# Patient Record
Sex: Male | Born: 1985 | Race: White | Hispanic: No | Marital: Single | Smoking: Never smoker
Health system: Southern US, Community
[De-identification: ages and names within clinical notes are randomized; demographics above are authoritative.]

---
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
## 2010-11-11 ENCOUNTER — Inpatient Hospital Stay (INDEPENDENT_AMBULATORY_CARE_PROVIDER_SITE_OTHER)
Admission: RE | Admit: 2010-11-11 | Discharge: 2010-11-11 | Disposition: A | Discharge status: Home / Self Care | Payer: Self-pay | Source: Ambulatory Visit | Attending: Emergency Medicine | Admitting: Emergency Medicine

## 2010-11-11 DIAGNOSIS — R05 Cough: Secondary | ICD-10-CM

## 2010-11-11 DIAGNOSIS — J069 Acute upper respiratory infection, unspecified: Secondary | ICD-10-CM

## 2010-11-14 ENCOUNTER — Inpatient Hospital Stay (INDEPENDENT_AMBULATORY_CARE_PROVIDER_SITE_OTHER)
Admission: RE | Admit: 2010-11-14 | Discharge: 2010-11-14 | Disposition: A | Discharge status: Home / Self Care | Payer: Self-pay | Source: Ambulatory Visit | Attending: Family Medicine | Admitting: Family Medicine

## 2010-11-14 DIAGNOSIS — J40 Bronchitis, not specified as acute or chronic: Secondary | ICD-10-CM

## 2010-12-01 ENCOUNTER — Inpatient Hospital Stay (INDEPENDENT_AMBULATORY_CARE_PROVIDER_SITE_OTHER)
Admission: RE | Admit: 2010-12-01 | Discharge: 2010-12-01 | Disposition: A | Discharge status: Home / Self Care | Payer: Self-pay | Source: Ambulatory Visit | Attending: Emergency Medicine | Admitting: Emergency Medicine

## 2010-12-01 DIAGNOSIS — H6120 Impacted cerumen, unspecified ear: Secondary | ICD-10-CM

## 2010-12-06 ENCOUNTER — Inpatient Hospital Stay (INDEPENDENT_AMBULATORY_CARE_PROVIDER_SITE_OTHER)
Admission: RE | Admit: 2010-12-06 | Discharge: 2010-12-06 | Disposition: A | Discharge status: Home / Self Care | Payer: Self-pay | Source: Ambulatory Visit | Attending: Family Medicine | Admitting: Family Medicine

## 2010-12-06 DIAGNOSIS — H6690 Otitis media, unspecified, unspecified ear: Secondary | ICD-10-CM

## 2013-09-02 ENCOUNTER — Encounter (HOSPITAL_COMMUNITY): Payer: Self-pay | Admitting: Emergency Medicine

## 2013-09-02 ENCOUNTER — Emergency Department (INDEPENDENT_AMBULATORY_CARE_PROVIDER_SITE_OTHER)
Admission: EM | Admit: 2013-09-02 | Discharge: 2013-09-02 | Disposition: A | Discharge status: Home / Self Care | Payer: Self-pay | Source: Home / Self Care | Attending: Emergency Medicine | Admitting: Emergency Medicine

## 2013-09-02 ENCOUNTER — Emergency Department (INDEPENDENT_AMBULATORY_CARE_PROVIDER_SITE_OTHER): Payer: Self-pay

## 2013-09-02 DIAGNOSIS — S63509A Unspecified sprain of unspecified wrist, initial encounter: Secondary | ICD-10-CM

## 2013-09-02 MED ORDER — IBUPROFEN 800 MG PO TABS
800.0000 mg | ORAL_TABLET | Freq: Once | ORAL | Status: AC
  Administered 2013-09-02: 800 mg via ORAL

## 2013-09-02 MED ORDER — HYDROCODONE-ACETAMINOPHEN 5-325 MG PO TABS: 1.0000 | ORAL_TABLET | ORAL | Status: AC | PRN

## 2013-09-02 MED ORDER — IBUPROFEN 800 MG PO TABS
ORAL_TABLET | ORAL | Status: AC
  Filled 2013-09-02: qty 1

## 2013-09-02 MED ORDER — TRAMADOL HCL 50 MG PO TABS: 50.0000 mg | ORAL_TABLET | Freq: Four times a day (QID) | ORAL | Status: AC | PRN

## 2013-09-02 NOTE — ED Provider Notes (Signed)
CSN: [id number]     Arrival date & time 09/02/13  1723 History   First MD Initiated Contact with Patient 09/02/13 1958     Chief Complaint  Patient presents with  . Wrist Pain    Patient is a 28 y.o. male presenting with wrist pain. The history is provided by the patient.  Wrist Pain This is a new problem. The current episode started more than 2 days ago. The problem occurs constantly. The problem has not changed since onset.The symptoms are aggravated by bending and twisting. Nothing relieves the symptoms.  Pt reports pain (R) wrist since Wednesday. States that on Tuesday at work he spent most of the day pushing a very heavy piece of equipment around. States when he awoke Wednesday AM he noted the pain. Describes the pain as a tenderness with sharp shooting pains at times especially w/ extension of his (R) hand.  History reviewed. No pertinent past medical history. History reviewed. No pertinent past surgical history. No family history on file. History  Substance Use Topics  . Smoking status: Never Smoker   . Smokeless tobacco: Not on file  . Alcohol Use: No    Review of Systems  All other systems reviewed and are negative.    Allergies  Review of patient's allergies indicates no known allergies.  Home Medications   Current Outpatient Rx  Name  Route  Sig  Dispense  Refill  . acetaminophen (TYLENOL) 325 MG tablet   Oral   Take 650 mg by mouth every 6 (six) hours as needed.         Marland Kitchen ibuprofen (ADVIL,MOTRIN) 200 MG tablet   Oral   Take 200 mg by mouth every 6 (six) hours as needed.         Marland Kitchen HYDROcodone-acetaminophen (NORCO/VICODIN) 5-325 MG per tablet   Oral   Take 1 tablet by mouth every 4 (four) hours as needed for severe pain.   10 tablet   0   . traMADol (ULTRAM) 50 MG tablet   Oral   Take 1 tablet (50 mg total) by mouth every 6 (six) hours as needed for severe pain.   15 tablet   0    BP 132/83  Pulse 54  Temp(Src) 98.7 F (37.1 C) (Oral)  Resp 16   SpO2 99% Physical Exam  Nursing note and vitals reviewed. Constitutional: He is oriented to person, place, and time. He appears well-developed and well-nourished.  HENT:  Head: Normocephalic and atraumatic.  Eyes: Conjunctivae are normal.  Cardiovascular: Bradycardia present.   In setting of young male w/ athletic build.  Pulmonary/Chest: Effort normal.  Musculoskeletal: He exhibits tenderness.  Mild swelling and TTP over the medial (R) dorsal wrist. No obvious deformity or open wound. Good ROM of fingers. Normal flexion and extension of wrist w/ increased pain on extension.  Neurological: He is alert and oriented to person, place, and time.  Skin: Skin is warm and dry.  Psychiatric: He has a normal mood and affect.    ED Course  Procedures (including critical care time) Labs Review Labs Reviewed - No data to display Imaging Review Dg Wrist Complete Right  09/02/2013   CLINICAL DATA:  Right wrist pain x3 days, no known injury  EXAM: RIGHT WRIST - COMPLETE 3+ VIEW  COMPARISON:  None.  FINDINGS: No fracture or dislocation is seen.  The joint spaces are preserved.  The visualized soft tissues are unremarkable.  IMPRESSION: No acute osseous abnormality is seen.   Electronically Signed  By: Charline BillsSriyesh  Krishnan M.D.   On: 09/02/2013 20:54     MDM   1. Wrist sprain    (R) wrist pain for 3 days after several hours of pushing a very heavy piece of equipment around at work. Imaging neg for fx. Good ROM, some increased pain w/ extension of (R) hand.  Will treat for sprain. Pt placed in a velcro wrist splint and home care instructions discussed and provided in writing. Pt to arrange f/u w/ ortho referral provided if not gradually improving over the next several days.     Leanne ChangKatherine P Demmi Sindt, NP 09/04/13 1310  Leanne ChangKatherine P Hughie Melroy, NP 09/04/13 1312

## 2013-09-02 NOTE — ED Notes (Signed)
Right wrist pain that started on Wednesday morning. Noticed when he woke that morning, he had shooting pain in right wrist.  No known injury.  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  Patient is right handed.  Particularly feels pain in wrist with bringing fingers back towards forearm

## 2013-09-02 NOTE — Discharge Instructions (Signed)
The likely source of your pain is a wrist sprain. Rest the wrist over the next 2 days using cold packs for 20-30 minutes several times a day. Wear the splint until your pain resolves. Take Ibuprofen 800 mg 3 times a day for the next 5 days. Use the medication for pain as prescribed for more severe pain. The Tramadol can be used at work for pain as it does not make you drowsy. If the pain does not improve or worsens over the next week to 10 days you will need to arrange follow up with Dr Eulah PontMurphy for further evaluation.  Wrist Sprain  A sprain is an injury in which a ligament that maintains the proper alignment of a joint is partially or completely torn. The ligaments of the wrist are susceptible to sprains. Sprains are classified into three categories. Grade 1 sprains cause pain, but the tendon is not lengthened. Grade 2 sprains include a lengthened ligament because the ligament is stretched or partially ruptured. With grade 2 sprains there is still function, although the function may be diminished. Grade 3 sprains are characterized by a complete tear of the tendon or muscle, and function is usually impaired. SYMPTOMS   Pain tenderness, inflammation, and/or bruising (contusion) of the injury.  A "pop" or tear felt and/or heard at the time of injury.  Decreased wrist function. CAUSES  A wrist sprain occurs when a force is placed on one or more ligaments that is greater than it/they can withstand. Common mechanisms of injury include:  Catching a ball with you hands.  Repetitive and/ or strenuous extension or flexion of the wrist. RISK INCREASES WITH:  Previous wrist injury.  Contact sports (boxing or wrestling).  Activities in which falling is common.  Poor strength and flexibility.  Improperly fitted or padded protective equipment. PREVENTION  Warm up and stretch properly before activity.  Allow for adequate recovery between workouts.  Maintain physical fitness:  Strength,  flexibility, and endurance.  Cardiovascular fitness.  Protect the wrist joint by limiting its motion with the use of taping, braces, or splints.  Protect the wrist after injury for 6 to 12 months. PROGNOSIS  The prognosis for wrist sprains depends on the degree of injury. Grade 1 sprains require 2 to 6 weeks of treatment. Grade 2 sprains require 6 to 8 weeks of treatment, and grade 3 sprains require up to 12 weeks.  RELATED COMPLICATIONS   Prolonged healing time, if improperly treated or re-injured.  Recurrent symptoms that result in a chronic problem.  Injury to nearby structures (bone, cartilage, nerves, or tendons).  Arthritis of the wrist.  Inability to compete in athletics at a high level.  Wrist stiffness or weakness.  Progression to a complete rupture of the ligament. TREATMENT  Treatment initially involves resting from any activities that aggravate the symptoms, and the use of ice and medications to help reduce pain and inflammation. Your caregiver may recommend immobilizing the wrist for a period of time in order to reduce stress on the ligament and allow for healing. After immobilization it is important to perform strengthening and stretching exercises to help regain strength and a full range of motion. These exercises may be completed at home or with a therapist. Surgery is not usually required for wrist sprains, unless the ligament has been ruptured (grade 3 sprain). MEDICATION   If pain medication is necessary, then nonsteroidal anti-inflammatory medications, such as aspirin and ibuprofen, or other minor pain relievers, such as acetaminophen, are often recommended.  Do not  take pain medication for 7 days before surgery.  Prescription pain relievers may be given if deemed necessary by your caregiver. Use only as directed and only as much as you need. HEAT AND COLD  Cold treatment (icing) relieves pain and reduces inflammation. Cold treatment should be applied for 10 to  15 minutes every 2 to 3 hours for inflammation and pain and immediately after any activity that aggravates your symptoms. Use ice packs or massage the area with a piece of ice (ice massage).  Heat treatment may be used prior to performing the stretching and strengthening activities prescribed by your caregiver, physical therapist, or athletic trainer. Use a heat pack or soak your injury in warm water. SEEK MEDICAL CARE IF:  Treatment seems to offer no benefit, or the condition worsens.  Any medications produce adverse side effects.

## 2013-09-05 NOTE — ED Provider Notes (Signed)
Medical screening examination/treatment/procedure(s) were performed by non-physician practitioner and as supervising physician I was immediately available for consultation/collaboration.  Ademide Schaberg, M.D.  Dquan Cortopassi C Alzada Brazee, MD 09/05/13 0749 

## 2014-11-22 ENCOUNTER — Ambulatory Visit (HOSPITAL_COMMUNITY)
Admission: RE | Admit: 2014-11-22 | Discharge: 2014-11-22 | Disposition: A | Discharge status: Home / Self Care | Payer: 59 | Source: Ambulatory Visit | Attending: Orthopedic Surgery | Admitting: Orthopedic Surgery

## 2014-11-22 ENCOUNTER — Other Ambulatory Visit (HOSPITAL_COMMUNITY): Payer: Self-pay | Admitting: Orthopedic Surgery

## 2014-11-22 DIAGNOSIS — Z048 Encounter for examination and observation for other specified reasons: Secondary | ICD-10-CM | POA: Diagnosis not present

## 2014-11-22 DIAGNOSIS — S0540XA Penetrating wound of orbit with or without foreign body, unspecified eye, initial encounter: Secondary | ICD-10-CM

## 2015-01-26 ENCOUNTER — Other Ambulatory Visit: Payer: Self-pay | Admitting: Orthopedic Surgery

## 2015-04-12 ENCOUNTER — Ambulatory Visit (HOSPITAL_BASED_OUTPATIENT_CLINIC_OR_DEPARTMENT_OTHER): Admit: 2015-04-12 | Payer: 59 | Admitting: Orthopedic Surgery

## 2015-04-12 ENCOUNTER — Encounter (HOSPITAL_BASED_OUTPATIENT_CLINIC_OR_DEPARTMENT_OTHER): Payer: Self-pay

## 2015-04-12 SURGERY — RECESSION, MUSCLE, GASTROCNEMIUS
Anesthesia: General | Site: Foot | Laterality: Left

## 2015-08-27 ENCOUNTER — Encounter (HOSPITAL_COMMUNITY): Payer: Self-pay | Admitting: Emergency Medicine

## 2015-08-27 ENCOUNTER — Emergency Department (HOSPITAL_COMMUNITY): Payer: PRIVATE HEALTH INSURANCE

## 2015-08-27 ENCOUNTER — Emergency Department (HOSPITAL_COMMUNITY)
Admission: EM | Admit: 2015-08-27 | Discharge: 2015-08-27 | Disposition: A | Discharge status: Home / Self Care | Payer: PRIVATE HEALTH INSURANCE | Attending: Emergency Medicine | Admitting: Emergency Medicine

## 2015-08-27 DIAGNOSIS — M79641 Pain in right hand: Secondary | ICD-10-CM | POA: Diagnosis present

## 2015-08-27 DIAGNOSIS — R2231 Localized swelling, mass and lump, right upper limb: Secondary | ICD-10-CM | POA: Diagnosis not present

## 2015-08-27 DIAGNOSIS — M7989 Other specified soft tissue disorders: Secondary | ICD-10-CM

## 2015-08-27 NOTE — ED Provider Notes (Signed)
CSN: [id number]     Arrival date & time 08/27/15  0718 History   First MD Initiated Contact with Patient 08/27/15 [phone]     Chief Complaint  Patient presents with  . Hand Pain     (Consider location/radiation/quality/duration/timing/severity/associated sxs/prior Treatment) HPI  30 year old male presents with right hand pain started yesterday shortly after lunch. 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 He is not sure if he injured it on something but during work knows that his hand was hurting and he had swelling over his right second MCP. Patient states the pain seems to be somewhat improved since original onset. He has full range of motion of his hand. Denies any increased warmth. No weakness or numbness. Has not taken anything for pain, declines pain meds/ice at this time.  History reviewed. No pertinent past medical history. History reviewed. No pertinent past surgical history. No family history on file. Social History  Substance Use Topics  . Smoking status: Never Smoker   . Smokeless tobacco: None  . Alcohol Use: No    Review of Systems  Musculoskeletal: Positive for joint swelling and arthralgias.  Skin: Negative for wound.  Neurological: Negative for weakness and numbness.  All other systems reviewed and are negative.     Allergies  Review of patient's allergies indicates no known allergies.  Home Medications   Prior to Admission medications   Medication Sig Start Date End Date Taking? Authorizing Provider  acetaminophen (TYLENOL) 325 MG tablet Take 650 mg by mouth every 6 (six) hours as needed.    Historical Provider, MD  HYDROcodone-acetaminophen (NORCO/VICODIN) 5-325 MG per tablet Take 1 tablet by mouth every 4 (four) hours as needed for severe pain. 09/02/13   Roma Kayser Schorr, NP  ibuprofen (ADVIL,MOTRIN) 200 MG tablet Take 200 mg by mouth every 6 (six) hours as needed.    Historical Provider, MD  traMADol (ULTRAM) 50 MG tablet Take 1 tablet (50 mg  total) by mouth every 6 (six) hours as needed for severe pain. 09/02/13   Roma Kayser Schorr, NP   BP 123/94 mmHg  Pulse 81  Temp(Src) 98.6 F (37 C) (Oral)  Resp 18  SpO2 98% Physical Exam  Constitutional: He is oriented to person, place, and time. He appears well-developed and well-nourished.  HENT:  Head: Normocephalic and atraumatic.  Nose: Nose normal.  Eyes: Right eye exhibits no discharge. Left eye exhibits no discharge.  Cardiovascular: Normal rate, regular rhythm and intact distal pulses.   Pulmonary/Chest: Effort normal.  Abdominal: He exhibits no distension.  Musculoskeletal: He exhibits no edema.       Right wrist: He exhibits normal range of motion and no tenderness.       Right hand: He exhibits tenderness (mild). He exhibits normal range of motion and no deformity.       Hands: Neurological: He is alert and oriented to person, place, and time.  Skin: Skin is warm and dry. No erythema.  Nursing note and vitals reviewed.   ED Course  Procedures (including critical care time) Labs Review Labs Reviewed - No data to display  Imaging Review Dg Hand Complete Right  08/27/2015  CLINICAL DATA:  30 year old male with new onset right hand pain. No known trauma. EXAM: RIGHT HAND - COMPLETE 3+ VIEW COMPARISON:  Prior radiographs of the right wrist 09/02/2013 FINDINGS: There is no evidence of fracture or dislocation. There is no evidence of arthropathy or other focal bone abnormality. Soft tissues are unremarkable. IMPRESSION: Negative.  Electronically Signed   By: Malachy Moan M.D.   On: 08/27/2015 08:12   I have personally reviewed and evaluated these images and lab results as part of my medical decision-making.   EKG Interpretation None      MDM   Final diagnoses:  Swelling of right hand    Patient most likely injured his hand but doesn't remember. No signs of infection, has full ROM without pain. Xray negative. D/c home with plan for nsaids and ice, return if  symptoms don't improve or worsen.    Pricilla Loveless, MD 08/27/15 [phone]

## 2015-08-27 NOTE — ED Notes (Signed)
Pt reports new onset right hand pain yesterday; FOM; denies injury.

## 2016-12-02 IMAGING — DX DG ORBITS FOR FOREIGN BODY
2 series · 2 of 2 positions shown · non-contrast
Comparison: None

CLINICAL DATA: Metal exposure to eyes, MR Delp

EXAM:
ORBITS FOR FOREIGN BODY - 2 VIEW

[orbital waters (1 of 2)]
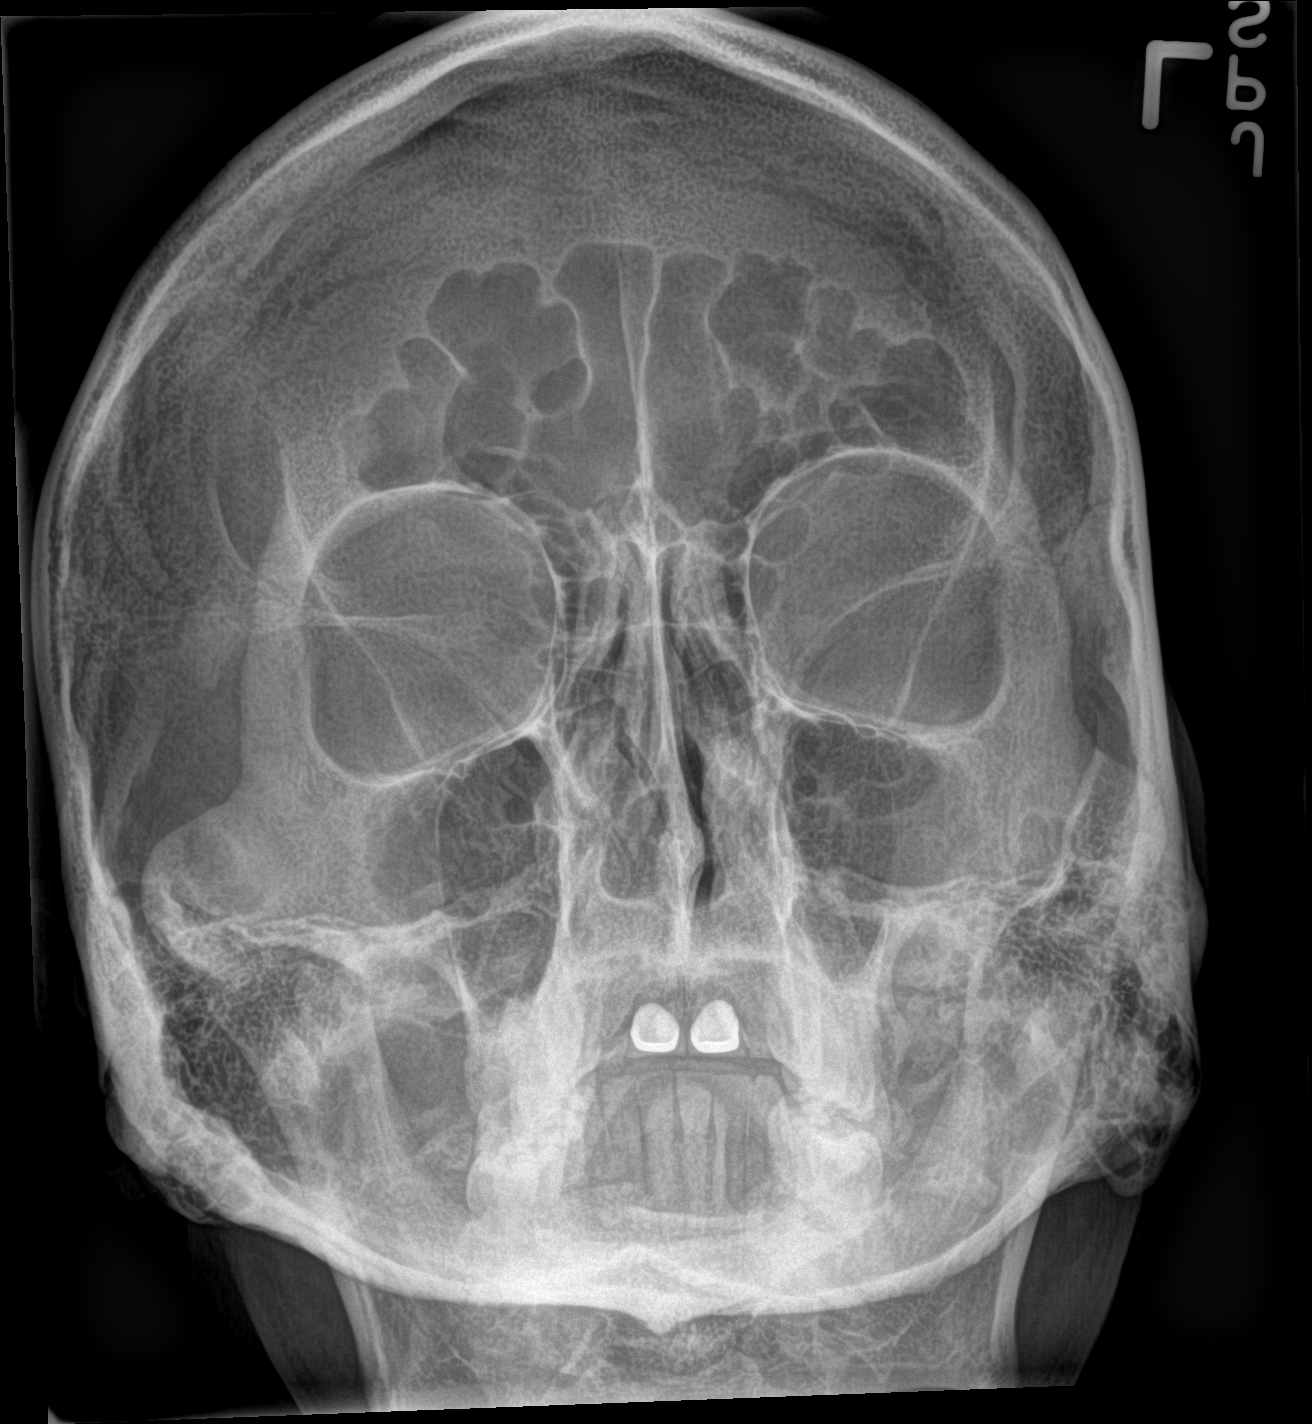

[orbital waters (2 of 2)]
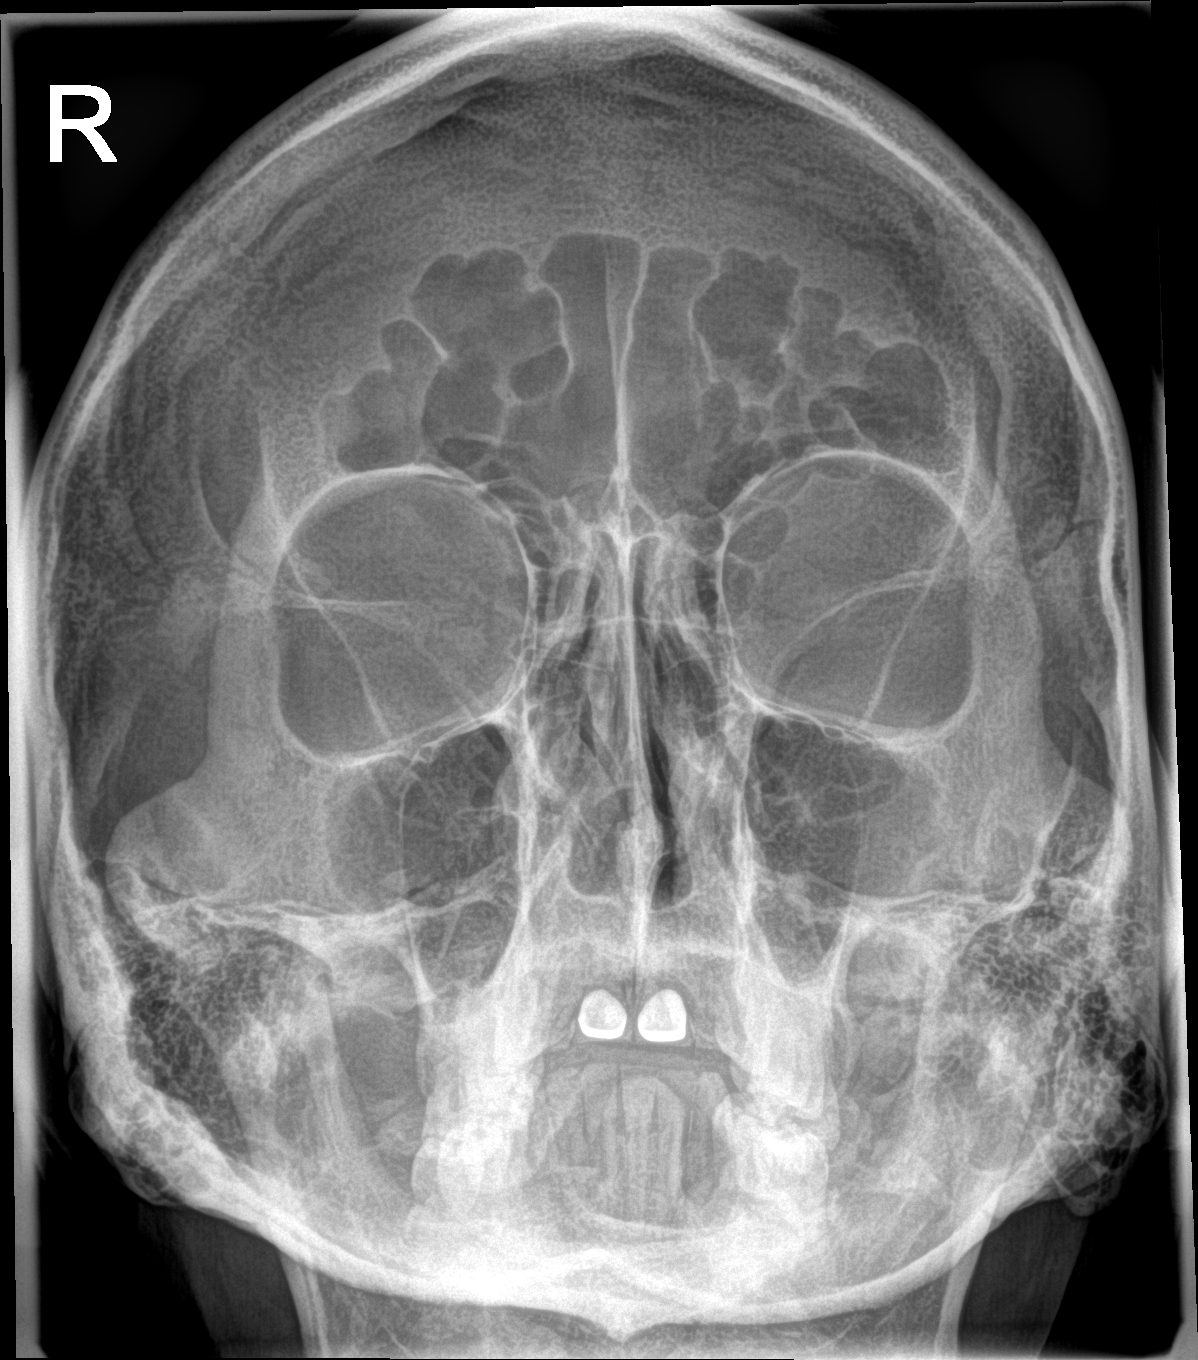

[2 of 2 positions shown; findings below may reference images not displayed]

FINDINGS: No orbital metallic foreign bodies.

Osseous mineralization normal for technique.

Nasal septum midline.

Paranasal sinuses clear.
IMPRESSION: No orbital metallic foreign bodies identified.

## 2022-02-06 ENCOUNTER — Emergency Department (HOSPITAL_BASED_OUTPATIENT_CLINIC_OR_DEPARTMENT_OTHER)
Admission: EM | Admit: 2022-02-06 | Discharge: 2022-02-06 | Disposition: A | Discharge status: Home / Self Care | Payer: PRIVATE HEALTH INSURANCE | Attending: Emergency Medicine | Admitting: Emergency Medicine

## 2022-02-06 ENCOUNTER — Emergency Department (HOSPITAL_BASED_OUTPATIENT_CLINIC_OR_DEPARTMENT_OTHER): Payer: PRIVATE HEALTH INSURANCE

## 2022-02-06 ENCOUNTER — Encounter (HOSPITAL_BASED_OUTPATIENT_CLINIC_OR_DEPARTMENT_OTHER): Payer: Self-pay | Admitting: Emergency Medicine

## 2022-02-06 DIAGNOSIS — R1031 Right lower quadrant pain: Secondary | ICD-10-CM | POA: Diagnosis present

## 2022-02-06 DIAGNOSIS — N201 Calculus of ureter: Secondary | ICD-10-CM | POA: Diagnosis not present

## 2022-02-06 LAB — COMPREHENSIVE METABOLIC PANEL
ALT: 28 U/L (ref 0–44)
AST: 23 U/L (ref 15–41)
Albumin: 4.5 g/dL (ref 3.5–5.0)
Alkaline Phosphatase: 101 U/L (ref 38–126)
Anion gap: 10 (ref 5–15)
BUN: 16 mg/dL (ref 6–20)
CO2: 26 mmol/L (ref 22–32)
Calcium: 9.2 mg/dL (ref 8.9–10.3)
Chloride: 104 mmol/L (ref 98–111)
Creatinine, Ser: 1.24 mg/dL (ref 0.61–1.24)
GFR, Estimated: >60 mL/min (ref >60)
Glucose, Bld: 129 mg/dL — ABNORMAL HIGH (ref 70–99)
Potassium: 3.7 mmol/L (ref 3.5–5.1)
Sodium: 140 mmol/L (ref 135–145)
Total Bilirubin: 1.2 mg/dL (ref 0.3–1.2)
Total Protein: 7.5 g/dL (ref 6.5–8.1)

## 2022-02-06 LAB — URINALYSIS, ROUTINE W REFLEX MICROSCOPIC
Bilirubin Urine: NEGATIVE
Glucose, UA: 100 mg/dL — AB
Ketones, ur: NEGATIVE mg/dL
Leukocytes,Ua: NEGATIVE
Nitrite: NEGATIVE
Protein, ur: NEGATIVE mg/dL
Specific Gravity, Urine: 1.02 (ref 1.005–1.030)
pH: 5.5 (ref 5.0–8.0)

## 2022-02-06 LAB — CBC WITH DIFFERENTIAL/PLATELET
Abs Immature Granulocytes: 0.01 10*3/uL (ref 0.00–0.07)
Basophils Absolute: 0 10*3/uL (ref 0.0–0.1)
Basophils Relative: 0 %
Eosinophils Absolute: 0.1 10*3/uL (ref 0.0–0.5)
Eosinophils Relative: 1 %
HCT: 49.1 % (ref 39.0–52.0)
Hemoglobin: 17.2 g/dL — ABNORMAL HIGH (ref 13.0–17.0)
Immature Granulocytes: 0 %
Lymphocytes Relative: 26 %
Lymphs Abs: 2 10*3/uL (ref 0.7–4.0)
MCH: 33.3 pg (ref 26.0–34.0)
MCHC: 35 g/dL (ref 30.0–36.0)
MCV: 95 fL (ref 80.0–100.0)
Monocytes Absolute: 0.4 10*3/uL (ref 0.1–1.0)
Monocytes Relative: 6 %
Neutro Abs: 5.2 10*3/uL (ref 1.7–7.7)
Neutrophils Relative %: 67 %
Platelets: 186 10*3/uL (ref 150–400)
RBC: 5.17 MIL/uL (ref 4.22–5.81)
RDW: 12.6 % (ref 11.5–15.5)
WBC: 7.7 10*3/uL (ref 4.0–10.5)
nRBC: 0 % (ref 0.0–0.2)

## 2022-02-06 LAB — URINALYSIS, MICROSCOPIC (REFLEX)

## 2022-02-06 LAB — LIPASE, BLOOD: Lipase: 37 U/L (ref 11–51)

## 2022-02-06 MED ORDER — IBUPROFEN 800 MG PO TABS: 800.0000 mg | ORAL_TABLET | Freq: Three times a day (TID) | ORAL | 0 refills | Status: AC | PRN

## 2022-02-06 MED ORDER — TAMSULOSIN HCL 0.4 MG PO CAPS: 0.4000 mg | ORAL_CAPSULE | Freq: Every day | ORAL | 0 refills | Status: AC

## 2022-02-06 MED ORDER — SENNOSIDES-DOCUSATE SODIUM 8.6-50 MG PO TABS: 1.0000 | ORAL_TABLET | Freq: Every evening | ORAL | 0 refills | Status: AC | PRN

## 2022-02-06 MED ORDER — OXYCODONE-ACETAMINOPHEN 5-325 MG PO TABS: 1.0000 | ORAL_TABLET | Freq: Four times a day (QID) | ORAL | 0 refills | Status: AC | PRN

## 2022-02-06 MED ORDER — ONDANSETRON 4 MG PO TBDP: 4.0000 mg | ORAL_TABLET | Freq: Three times a day (TID) | ORAL | 0 refills | Status: AC | PRN

## 2022-02-06 MED ORDER — ONDANSETRON HCL 4 MG/2ML IJ SOLN
4.0000 mg | Freq: Once | INTRAMUSCULAR | Status: AC
  Administered 2022-02-06: 4 mg via INTRAVENOUS
  Filled 2022-02-06: qty 2

## 2022-02-06 MED ORDER — KETOROLAC TROMETHAMINE 30 MG/ML IJ SOLN
30.0000 mg | Freq: Once | INTRAMUSCULAR | Status: AC
  Administered 2022-02-06: 30 mg via INTRAVENOUS
  Filled 2022-02-06: qty 1

## 2022-02-06 NOTE — Discharge Instructions (Signed)

## 2022-02-06 NOTE — ED Notes (Signed)
During triage process pt began vomiting

## 2022-02-06 NOTE — ED Provider Notes (Signed)
Emergency Department Provider Note   I have reviewed the triage vital signs and the nursing notes.   HISTORY  Chief Complaint Abdominal Pain   HPI John Anderson is a 36 y.o. male with prior history of kidney stone presents to the emergency department for evaluation of right lower quadrant pain with nausea and vomiting.  He reports similar symptoms in the past with a kidney stone.  He was ultimately able to pass that stone without urology intervention.  With this episode he has not had fevers or chills.  No blood in the urine.  No back or flank pain.  No pain to the testicle areas or groin.   History reviewed. No pertinent past medical history.  Review of Systems  Constitutional: No fever/chills Eyes: No visual changes. ENT: No sore throat. Cardiovascular: Denies chest pain. Respiratory: Denies shortness of breath. Gastrointestinal: Positive RLQ abdominal pain.  Positive nausea and vomiting.  No diarrhea.  No constipation. Genitourinary: Negative for dysuria. Musculoskeletal: Negative for back pain. Skin: Negative for rash. Neurological: Negative for headaches, focal weakness or numbness.   ____________________________________________   PHYSICAL EXAM:  VITAL SIGNS: ED Triage Vitals  Enc Vitals Group     BP 02/06/22 0704 (!) 126/102     Pulse Rate 02/06/22 0704 69     Resp 02/06/22 0704 18     Temp 02/06/22 0704 97.8 F (36.6 C)     Temp Source 02/06/22 0704 Oral     SpO2 02/06/22 0704 99 %     Weight 02/06/22 0700 144 lb (65.3 kg)     Height 02/06/22 0700 5\' 3"  (1.6 m)   Constitutional: Alert and oriented. Patient appears uncomfortable on arrival with frequent moving and shifting in bed.  Eyes: Conjunctivae are normal. Head: Atraumatic. Nose: No congestion/rhinnorhea. Mouth/Throat: Mucous membranes are moist.   Neck: No stridor.   Cardiovascular: Normal rate, regular rhythm. Good peripheral circulation. Grossly normal heart sounds.   Respiratory: Normal  respiratory effort.  No retractions. Lungs CTAB. Gastrointestinal: Soft with mild tenderness in the RLQ. No peritonitis. No distention.  Musculoskeletal: No lower extremity tenderness nor edema. No gross deformities of extremities. Neurologic:  Normal speech and language. No gross focal neurologic deficits are appreciated.  Skin:  Skin is warm, dry and intact. No rash noted.   ____________________________________________   LABS (all labs ordered are listed, but only abnormal results are displayed)  Labs Reviewed  COMPREHENSIVE METABOLIC PANEL - Abnormal; Notable for the following components:      Result Value   Glucose, Bld 129 (*)    All other components within normal limits  CBC WITH DIFFERENTIAL/PLATELET - Abnormal; Notable for the following components:   Hemoglobin 17.2 (*)    All other components within normal limits  URINALYSIS, ROUTINE W REFLEX MICROSCOPIC - Abnormal; Notable for the following components:   Glucose, UA 100 (*)    Hgb urine dipstick MODERATE (*)    All other components within normal limits  URINALYSIS, MICROSCOPIC (REFLEX) - Abnormal; Notable for the following components:   Bacteria, UA RARE (*)    All other components within normal limits  URINE CULTURE  LIPASE, BLOOD   ____________________________________________   PROCEDURES  Procedure(s) performed:   Procedures   ____________________________________________   INITIAL IMPRESSION / ASSESSMENT AND PLAN / ED COURSE  Pertinent labs & imaging results that were available during my care of the patient were reviewed by me and considered in my medical decision making (see chart for details).   This patient  is Presenting for Evaluation of abdominal pain, which does require a range of treatment options, and is a complaint that involves a high risk of morbidity and mortality.  The Differential Diagnoses includes but is not exclusive to acute appendicitis, renal colic, testicular torsion, urinary tract  infection, prostatitis,  diverticulitis, small bowel obstruction, colitis, abdominal aortic aneurysm, gastroenteritis, constipation etc.   Critical Interventions-    Medications  ondansetron (ZOFRAN) injection 4 mg (4 mg Intravenous Given 02/06/22 0735)  ketorolac (TORADOL) 30 MG/ML injection 30 mg (30 mg Intravenous Given 02/06/22 0739)    Reassessment after intervention: Pain managed well in the ED.    I did obtain Additional Historical Information from family at bedside.    Clinical Laboratory Tests Ordered, included no leukocytosis or acute kidney injury.  Lipase negative.  Radiologic Tests Ordered, included CT renal. I independently interpreted the images and agree with radiology interpretation.   Cardiac Monitor Tracing which shows NSR.   Medical Decision Making: Summary:  Patient presents emergency department the right lower quadrant abdominal pain consistent mostly with renal colic.  He does have some mild tenderness raising suspicion for alternate etiologies such as acute appendicitis of the lower suspicion for this clinically.  Appears to have ureteral stone at the right UVJ on my assessment of the imaging but will wait for official radiology interpretation.  Patient's pain and nausea are improved with Toradol and Zofran here.   Reevaluation with update and discussion with patient. CT showing ureteral stone on the side of the patient's pain. He is much more comfortable on re-evaluation. Plan for pain mgmt at home with plan for patient to call Urology for follow up.   Considered admission but pain is well managed and no indication for emergent removal of stone by Urology.   Disposition: discharge  ____________________________________________  FINAL CLINICAL IMPRESSION(S) / ED DIAGNOSES  Final diagnoses:  Right ureteral stone     NEW OUTPATIENT MEDICATIONS STARTED DURING THIS VISIT:  Discharge Medication List as of 02/06/2022  9:31 AM     START taking these medications    Details  ondansetron (ZOFRAN-ODT) 4 MG disintegrating tablet Take 1 tablet (4 mg total) by mouth every 8 (eight) hours as needed., Starting Thu 02/06/2022, Normal    oxyCODONE-acetaminophen (PERCOCET/ROXICET) 5-325 MG tablet Take 1 tablet by mouth every 6 (six) hours as needed for severe pain., Starting Thu 02/06/2022, Normal    senna-docusate (SENOKOT-S) 8.6-50 MG tablet Take 1 tablet by mouth at bedtime as needed for moderate constipation., Starting Thu 02/06/2022, Normal    tamsulosin (FLOMAX) 0.4 MG CAPS capsule Take 1 capsule (0.4 mg total) by mouth daily for 14 days., Starting Thu 02/06/2022, Until Thu 02/20/2022, Normal        Note:  This document was prepared using Dragon voice recognition software and may include unintentional dictation errors.  Alona Bene, MD, [facility] Emergency Medicine    Nikelle Malatesta, Arlyss Repress, MD 02/10/22 [phone]

## 2022-02-06 NOTE — ED Notes (Signed)
ED Provider at bedside. 

## 2022-02-06 NOTE — ED Triage Notes (Signed)
Pt states he is having pain in his right lower quadrant  Pt states he thinks he may have a kidney stone  Has hx of same  Pt has nausea without vomiting   Denies any urinary sxs or back pain   States he has a little pain in his groin area

## 2022-02-07 LAB — URINE CULTURE: Culture: NO GROWTH

## 2022-08-06 ENCOUNTER — Encounter (HOSPITAL_BASED_OUTPATIENT_CLINIC_OR_DEPARTMENT_OTHER): Payer: Self-pay | Admitting: Emergency Medicine

## 2022-08-06 ENCOUNTER — Emergency Department (HOSPITAL_BASED_OUTPATIENT_CLINIC_OR_DEPARTMENT_OTHER): Payer: PRIVATE HEALTH INSURANCE

## 2022-08-06 ENCOUNTER — Other Ambulatory Visit: Payer: Self-pay

## 2022-08-06 ENCOUNTER — Emergency Department (HOSPITAL_BASED_OUTPATIENT_CLINIC_OR_DEPARTMENT_OTHER)
Admission: EM | Admit: 2022-08-06 | Discharge: 2022-08-06 | Disposition: A | Discharge status: Home / Self Care | Payer: PRIVATE HEALTH INSURANCE | Attending: Emergency Medicine | Admitting: Emergency Medicine

## 2022-08-06 DIAGNOSIS — Z20822 Contact with and (suspected) exposure to covid-19: Secondary | ICD-10-CM | POA: Insufficient documentation

## 2022-08-06 DIAGNOSIS — M940 Chondrocostal junction syndrome [Tietze]: Secondary | ICD-10-CM | POA: Insufficient documentation

## 2022-08-06 DIAGNOSIS — J101 Influenza due to other identified influenza virus with other respiratory manifestations: Secondary | ICD-10-CM | POA: Insufficient documentation

## 2022-08-06 DIAGNOSIS — J069 Acute upper respiratory infection, unspecified: Secondary | ICD-10-CM

## 2022-08-06 LAB — RESP PANEL BY RT-PCR (RSV, FLU A&B, COVID)  RVPGX2
Influenza A by PCR: POSITIVE — AB
Influenza B by PCR: NEGATIVE
Resp Syncytial Virus by PCR: NEGATIVE
SARS Coronavirus 2 by RT PCR: NEGATIVE

## 2022-08-06 MED ORDER — LIDOCAINE 5 % EX PTCH
1.0000 | MEDICATED_PATCH | CUTANEOUS | Status: DC
  Administered 2022-08-06: 1 via TRANSDERMAL
  Filled 2022-08-06: qty 1

## 2022-08-06 MED ORDER — LIDOCAINE 5 % EX PTCH: 1.0000 | MEDICATED_PATCH | CUTANEOUS | 0 refills | Status: AC

## 2022-08-06 MED ORDER — BENZONATATE 100 MG PO CAPS: 100.0000 mg | ORAL_CAPSULE | Freq: Three times a day (TID) | ORAL | 0 refills | Status: DC

## 2022-08-06 MED ORDER — BENZONATATE 100 MG PO CAPS
100.0000 mg | ORAL_CAPSULE | Freq: Once | ORAL | Status: AC
  Administered 2022-08-06: 100 mg via ORAL
  Filled 2022-08-06: qty 1

## 2022-08-06 NOTE — Discharge Instructions (Addendum)
Please check the portal to see if you have COVID or flu tonight.  Make sure you are drinking lots of fluids, and using lidocaine patch for your chest discomfort.  You can also use the Tessalon Perles to help with your cough.  And take Tylenol and ibuprofen over-the-counter.  If you have severe shortness of breath severe chest pain, or intractable nausea or vomiting please return to the ER.

## 2022-08-06 NOTE — ED Triage Notes (Signed)
Pt w/ cough and not feeling well since Sun

## 2022-08-06 NOTE — ED Provider Notes (Signed)
[facility] Provider Note   CSN: [id number] Arrival date & time: 08/06/22  1705     History  Chief Complaint  Patient presents with   Cough    John Anderson is a 37 y.o. male, no pertinent past medical history, who presents to the ED secondary to a productive cough, headache, and chills for the last 3 days.  States that he has chest pain when he coughs, in the middle of his chest, denies any chest pain at rest or exertion.  Denies any nausea, vomiting, diarrhea.    Home Medications Prior to Admission medications   Medication Sig Start Date End Date Taking? Authorizing Provider  benzonatate (TESSALON) 100 MG capsule Take 1 capsule (100 mg total) by mouth every 8 (eight) hours. 08/06/22  Yes Romin Divita L, PA  lidocaine (LIDODERM) 5 % Place 1 patch onto the skin daily. Remove & Discard patch within 12 hours or as directed by MD 08/06/22  Yes Rawson Minix L, PA  acetaminophen (TYLENOL) 325 MG tablet Take 650 mg by mouth every 6 (six) hours as needed.    [provider]  HYDROcodone-acetaminophen (NORCO/VICODIN) 5-325 MG per tablet Take 1 tablet by mouth every 4 (four) hours as needed for severe pain. 09/02/13   Schorr, Rhetta Mura, FNP  ibuprofen (ADVIL) 800 MG tablet Take 1 tablet (800 mg total) by mouth every 8 (eight) hours as needed for moderate pain. 02/06/22   Long, Wonda Olds, MD  ondansetron (ZOFRAN-ODT) 4 MG disintegrating tablet Take 1 tablet (4 mg total) by mouth every 8 (eight) hours as needed. 02/06/22   Long, Wonda Olds, MD  oxyCODONE-acetaminophen (PERCOCET/ROXICET) 5-325 MG tablet Take 1 tablet by mouth every 6 (six) hours as needed for severe pain. 02/06/22   Long, Wonda Olds, MD  senna-docusate (SENOKOT-S) 8.6-50 MG tablet Take 1 tablet by mouth at bedtime as needed for moderate constipation. 02/06/22   Long, Wonda Olds, MD  traMADol (ULTRAM) 50 MG tablet Take 1 tablet (50 mg total) by mouth every 6 (six) hours as needed for severe  pain. 09/02/13   Schorr, Rhetta Mura, FNP      Allergies    Patient has no known allergies.    Review of Systems   Review of Systems  Constitutional:  Positive for chills and fatigue.  HENT:  Negative for ear pain.   Respiratory:  Positive for cough.     Physical Exam Updated Vital Signs BP 124/86 (BP Location: Right Arm)   Pulse 90   Temp 99.6 F (37.6 C)   Resp 18   Ht 5\' 3"  (1.6 m)   Wt 65.8 kg   SpO2 100%   BMI 25.69 kg/m  Physical Exam Vitals and nursing note reviewed.  Constitutional:      General: He is not in acute distress.    Appearance: He is well-developed.  HENT:     Head: Normocephalic and atraumatic.  Eyes:     Conjunctiva/sclera: Conjunctivae normal.  Cardiovascular:     Rate and Rhythm: Normal rate and regular rhythm.     Heart sounds: No murmur heard. Pulmonary:     Effort: Pulmonary effort is normal. No respiratory distress.     Breath sounds: Normal breath sounds.  Abdominal:     Palpations: Abdomen is soft.     Tenderness: There is no abdominal tenderness.  Musculoskeletal:        General: No swelling.     Cervical back: Neck supple.  Comments: +TTP Of sternum  Skin:    General: Skin is warm and dry.     Capillary Refill: Capillary refill takes less than 2 seconds.  Neurological:     Mental Status: He is alert.  Psychiatric:        Mood and Affect: Mood normal.     ED Results / Procedures / Treatments   Labs (all labs ordered are listed, but only abnormal results are displayed) Labs Reviewed  RESP PANEL BY RT-PCR (RSV, FLU A&B, COVID)  RVPGX2 - Abnormal; Notable for the following components:      Result Value   Influenza A by PCR POSITIVE (*)    All other components within normal limits    EKG None  Radiology DG Chest 2 View  Result Date: 08/06/2022 CLINICAL DATA:  Cough and chest pain. EXAM: CHEST - 2 VIEW COMPARISON:  01/14/2016 FINDINGS: Heart size and mediastinal contours appear normal. There is no pleural effusion or  edema. No airspace opacities identified. Scoliosis deformity is identified which appears convex towards the right. IMPRESSION: No active cardiopulmonary disease. Electronically Signed   By: Kerby Moors M.D.   On: 08/06/2022 17:49    Procedures Procedures    Medications Ordered in ED Medications  lidocaine (LIDODERM) 5 % 1 patch (1 patch Transdermal Patch Applied 08/06/22 1748)  benzonatate (TESSALON) capsule 100 mg (100 mg Oral Given 08/06/22 1748)    ED Course/ Medical Decision Making/ A&P                             Medical Decision Making Patient is a 37 year old male, here for cough, headache, chills, also complained of chest pain with  coughing.  Denies any shortness of breath. We will obtain chest x-ray, viral panel for further evaluation.  Patient is overall well-appearing, nonlabored respirations.  Amount and/or Complexity of Data Reviewed Radiology: ordered.    Details: Chest x-ray clear. Discussion of management or test interpretation with external provider(s): We discussed, patient not feeling well, we will do Tessalon Perles lidocaine patch, he states that he had symptomatic improvement with this, well-appearing no murmurs, no shortness of breath, we discussed and following up on line for his COVID/flu results, patient was in agreement with this.  Work note provided, return precautions emphasized.  We discussed treatment of costochondritis, chest pain, and return precautions and he voiced understanding.  Risk Prescription drug management.    Final Clinical Impression(s) / ED Diagnoses Final diagnoses:  Viral upper respiratory tract infection  Costochondritis    Rx / DC Orders ED Discharge Orders          Ordered    benzonatate (TESSALON) 100 MG capsule  Every 8 hours        08/06/22 1802    lidocaine (LIDODERM) 5 %  Every 24 hours        08/06/22 1802              Osvaldo Shipper, [place] 08/06/22 1812    Margette Fast, MD 08/11/22 [phone]

## 2022-08-06 NOTE — ED Notes (Signed)
Discharge instructions reviewed with patient. Patient verbalizes understanding, no further questions at this time. Medications/prescriptions and follow up information provided. No acute distress noted at time of departure.  

## 2022-12-24 ENCOUNTER — Emergency Department (HOSPITAL_BASED_OUTPATIENT_CLINIC_OR_DEPARTMENT_OTHER): Payer: Self-pay

## 2022-12-24 ENCOUNTER — Emergency Department (HOSPITAL_BASED_OUTPATIENT_CLINIC_OR_DEPARTMENT_OTHER)
Admission: EM | Admit: 2022-12-24 | Discharge: 2022-12-24 | Disposition: A | Discharge status: Home / Self Care | Payer: Self-pay | Attending: Emergency Medicine | Admitting: Emergency Medicine

## 2022-12-24 ENCOUNTER — Other Ambulatory Visit: Payer: Self-pay

## 2022-12-24 ENCOUNTER — Encounter (HOSPITAL_BASED_OUTPATIENT_CLINIC_OR_DEPARTMENT_OTHER): Payer: Self-pay

## 2022-12-24 DIAGNOSIS — R051 Acute cough: Secondary | ICD-10-CM | POA: Insufficient documentation

## 2022-12-24 DIAGNOSIS — Z1152 Encounter for screening for COVID-19: Secondary | ICD-10-CM | POA: Insufficient documentation

## 2022-12-24 LAB — RESP PANEL BY RT-PCR (RSV, FLU A&B, COVID)  RVPGX2
Influenza A by PCR: NEGATIVE
Influenza B by PCR: NEGATIVE
Resp Syncytial Virus by PCR: NEGATIVE
SARS Coronavirus 2 by RT PCR: NEGATIVE

## 2022-12-24 MED ORDER — BENZONATATE 100 MG PO CAPS: 100.0000 mg | ORAL_CAPSULE | Freq: Three times a day (TID) | ORAL | 0 refills | Status: AC

## 2022-12-24 MED ORDER — DEXTROMETHORPHAN HBR 15 MG/5ML PO SYRP: 10.0000 mL | ORAL_SOLUTION | Freq: Four times a day (QID) | ORAL | 0 refills | Status: AC | PRN

## 2022-12-24 NOTE — Discharge Instructions (Addendum)
Please follow-up with your primary care doctor, return to the ER if you have severe shortness of breath, or start coughing up blood.

## 2022-12-24 NOTE — ED Triage Notes (Signed)
Pt arrives with c/o cough that started a week ago. Pt denies CP or CP. Pt endorses some nausea.

## 2022-12-24 NOTE — ED Provider Notes (Signed)
[facility] Provider Note   CSN: [id number] Arrival date & time: 12/24/22  1840     History  Chief Complaint  Patient presents with   Cough    John Anderson is a 37 y.o. male, pertinent past medical history, who presents to the ED secondary to a cough that is midline for the last week.  He states last week on vacation he got sick developed a fever sore throat, and cough.  He states that the fever sore throat, fatigue has improved, but he has a persistent cough.  He describes the cough as dry, and has not been coughing up anything or any blood.  States he hardly can eat because he coughed so much.     Home Medications Prior to Admission medications   Medication Sig Start Date End Date Taking? Authorizing Provider  benzonatate (TESSALON) 100 MG capsule Take 1 capsule (100 mg total) by mouth every 8 (eight) hours. 12/24/22  Yes Yuma Pacella L, PA  dextromethorphan 15 MG/5ML syrup Take 10 mLs (30 mg total) by mouth 4 (four) times daily as needed for cough. 12/24/22  Yes Jannessa Ogden L, PA  acetaminophen (TYLENOL) 325 MG tablet Take 650 mg by mouth every 6 (six) hours as needed.    [provider]  HYDROcodone-acetaminophen (NORCO/VICODIN) 5-325 MG per tablet Take 1 tablet by mouth every 4 (four) hours as needed for severe pain. 09/02/13   Schorr, Roma Kayser, FNP  ibuprofen (ADVIL) 800 MG tablet Take 1 tablet (800 mg total) by mouth every 8 (eight) hours as needed for moderate pain. 02/06/22   Long, Arlyss Repress, MD  lidocaine (LIDODERM) 5 % Place 1 patch onto the skin daily. Remove & Discard patch within 12 hours or as directed by MD 08/06/22   Revecca Nachtigal L, PA  ondansetron (ZOFRAN-ODT) 4 MG disintegrating tablet Take 1 tablet (4 mg total) by mouth every 8 (eight) hours as needed. 02/06/22   Long, Arlyss Repress, MD  oxyCODONE-acetaminophen (PERCOCET/ROXICET) 5-325 MG tablet Take 1 tablet by mouth every 6 (six) hours as needed for severe pain. 02/06/22    Long, Arlyss Repress, MD  senna-docusate (SENOKOT-S) 8.6-50 MG tablet Take 1 tablet by mouth at bedtime as needed for moderate constipation. 02/06/22   Long, Arlyss Repress, MD  traMADol (ULTRAM) 50 MG tablet Take 1 tablet (50 mg total) by mouth every 6 (six) hours as needed for severe pain. 09/02/13   Schorr, Roma Kayser, FNP      Allergies    Patient has no known allergies.    Review of Systems   Review of Systems  Respiratory:  Positive for cough. Negative for shortness of breath.     Physical Exam Updated Vital Signs BP 128/83   Pulse 68   Temp 98.1 F (36.7 C)   Resp 19   Ht 5\' 3"  (1.6 m)   Wt 65.8 kg   SpO2 100%   BMI 25.69 kg/m  Physical Exam Vitals and nursing note reviewed.  Constitutional:      General: He is not in acute distress.    Appearance: He is well-developed.  HENT:     Head: Normocephalic and atraumatic.  Eyes:     Conjunctiva/sclera: Conjunctivae normal.  Cardiovascular:     Rate and Rhythm: Normal rate and regular rhythm.     Heart sounds: No murmur heard. Pulmonary:     Effort: Pulmonary effort is normal. No respiratory distress.     Breath sounds: Normal  breath sounds.     Comments: +dry cough Abdominal:     Palpations: Abdomen is soft.     Tenderness: There is no abdominal tenderness.  Musculoskeletal:        General: No swelling.     Cervical back: Neck supple.  Skin:    General: Skin is warm and dry.     Capillary Refill: Capillary refill takes less than 2 seconds.  Neurological:     Mental Status: He is alert.  Psychiatric:        Mood and Affect: Mood normal.     ED Results / Procedures / Treatments   Labs (all labs ordered are listed, but only abnormal results are displayed) Labs Reviewed  RESP PANEL BY RT-PCR (RSV, FLU A&B, COVID)  RVPGX2    EKG None  Radiology DG Chest 2 View  Result Date: 12/24/2022 CLINICAL DATA:  Cough that started a week ago EXAM: CHEST - 2 VIEW COMPARISON:  08/06/2022 FINDINGS: Stable cardiomediastinal  silhouette. No focal consolidation, pleural effusion, or pneumothorax. No displaced rib fractures. IMPRESSION: No active cardiopulmonary disease. Electronically Signed   By: Minerva Fester M.D.   On: 12/24/2022 19:28    Procedures Procedures    Medications Ordered in ED Medications - No data to display  ED Course/ Medical Decision Making/ A&P                             Medical Decision Making Patient is a 37 year old male, here for cough that started about a week ago initially had a URI symptoms, and now has resolved but has persistent cough.  We obtained a chest x-ray COVID/flu for further evaluation.  Amount and/or Complexity of Data Reviewed Labs:     Details: Negative COVID/flu Radiology: ordered.    Details: Chest x-ray shows no acute findings Discussion of management or test interpretation with external provider(s): Discussed with patient, chest x-ray is clear, negative COVID/flu he is likely has a postviral cough.  I prescribed him Tessalon Perles as well as cough syrup to help with the cough, and informed him on return precautions and he voiced understanding.  He is not coughing up any blood, his cough is dry.  He denies any chest pain or shortness of breath.  Risk OTC drugs. Prescription drug management.    Final Clinical Impression(s) / ED Diagnoses Final diagnoses:  Acute cough    Rx / DC Orders ED Discharge Orders          Ordered    benzonatate (TESSALON) 100 MG capsule  Every 8 hours        12/24/22 2107    dextromethorphan 15 MG/5ML syrup  4 times daily PRN        12/24/22 2107              Pete Pelt, [place] 12/24/22 2113    Terrilee Files, MD 12/25/22 1007

## 2023-08-13 ENCOUNTER — Emergency Department (HOSPITAL_BASED_OUTPATIENT_CLINIC_OR_DEPARTMENT_OTHER): Payer: Self-pay

## 2023-08-13 ENCOUNTER — Other Ambulatory Visit: Payer: Self-pay

## 2023-08-13 ENCOUNTER — Encounter (HOSPITAL_BASED_OUTPATIENT_CLINIC_OR_DEPARTMENT_OTHER): Payer: Self-pay | Admitting: Radiology

## 2023-08-13 ENCOUNTER — Emergency Department (HOSPITAL_BASED_OUTPATIENT_CLINIC_OR_DEPARTMENT_OTHER)
Admission: EM | Admit: 2023-08-13 | Discharge: 2023-08-13 | Disposition: A | Discharge status: Home / Self Care | Payer: Self-pay | Attending: Emergency Medicine | Admitting: Emergency Medicine

## 2023-08-13 DIAGNOSIS — R0789 Other chest pain: Secondary | ICD-10-CM | POA: Insufficient documentation

## 2023-08-13 LAB — BASIC METABOLIC PANEL
Anion gap: 8 (ref 5–15)
BUN: 22 mg/dL — ABNORMAL HIGH (ref 6–20)
CO2: 22 mmol/L (ref 22–32)
Calcium: 9.5 mg/dL (ref 8.9–10.3)
Chloride: 107 mmol/L (ref 98–111)
Creatinine, Ser: 1.04 mg/dL (ref 0.61–1.24)
GFR, Estimated: >60 mL/min (ref >60)
Glucose, Bld: 90 mg/dL (ref 70–99)
Potassium: 3.6 mmol/L (ref 3.5–5.1)
Sodium: 137 mmol/L (ref 135–145)

## 2023-08-13 LAB — CBC
HCT: 44.4 % (ref 39.0–52.0)
Hemoglobin: 15.8 g/dL (ref 13.0–17.0)
MCH: 33.1 pg (ref 26.0–34.0)
MCHC: 35.6 g/dL (ref 30.0–36.0)
MCV: 92.9 fL (ref 80.0–100.0)
Platelets: 163 10*3/uL (ref 150–400)
RBC: 4.78 MIL/uL (ref 4.22–5.81)
RDW: 12.8 % (ref 11.5–15.5)
WBC: 6.7 10*3/uL (ref 4.0–10.5)
nRBC: 0 % (ref 0.0–0.2)

## 2023-08-13 LAB — TROPONIN I (HIGH SENSITIVITY)
Troponin I (High Sensitivity): 3 ng/L (ref <18)
Troponin I (High Sensitivity): 3 ng/L (ref <18)

## 2023-08-13 MED ORDER — ALUM & MAG HYDROXIDE-SIMETH 200-200-20 MG/5ML PO SUSP
30.0000 mL | Freq: Once | ORAL | Status: AC
  Administered 2023-08-13: 30 mL via ORAL
  Filled 2023-08-13: qty 30

## 2023-08-13 NOTE — ED Provider Notes (Signed)
 [facility] Provider Note  CSN: [id number] Arrival date & time: 08/13/23 0205  Chief Complaint(s) Chest Pain  HPI John Anderson is a 38 y.o. male here with left-sided chest pain.  Squeezing/sharp pain.  Initially felt 2 days ago for approximately 5 to 10 minutes and then sporadically went away.  Pain returned this evening around 11 PM and constant since then.  Intermittent and fluctuating every 10 minutes.  This occurred after eating.  No associated nausea.  No shortness of breath.  No recent fevers or infections.  No known sick contacts.  No prior history of hypertension, hyperlipidemia, diabetes.  No family history of CAD less than 62 years old. No recent travel.  The history is provided by the patient.    Past Medical History History reviewed. No pertinent past medical history. There are no active problems to display for this patient.  Home Medication(s) Prior to Admission medications   Medication Sig Start Date End Date Taking? Authorizing Provider  acetaminophen  (TYLENOL ) 325 MG tablet Take 650 mg by mouth every 6 (six) hours as needed.    [provider]  benzonatate  (TESSALON ) 100 MG capsule Take 1 capsule (100 mg total) by mouth every 8 (eight) hours. 12/24/22   Small, Brooke L, PA  dextromethorphan  15 MG/5ML syrup Take 10 mLs (30 mg total) by mouth 4 (four) times daily as needed for cough. 12/24/22   Small, Brooke L, PA  HYDROcodone -acetaminophen  (NORCO/VICODIN) 5-325 MG per tablet Take 1 tablet by mouth every 4 (four) hours as needed for severe pain. 09/02/13   Schorr, Comer SQUIBB, FNP  ibuprofen  (ADVIL ) 800 MG tablet Take 1 tablet (800 mg total) by mouth every 8 (eight) hours as needed for moderate pain. 02/06/22   Long, Fonda MATSU, MD  lidocaine  (LIDODERM ) 5 % Place 1 patch onto the skin daily. Remove & Discard patch within 12 hours or as directed by MD 08/06/22   Small, Brooke L, PA  ondansetron  (ZOFRAN -ODT) 4 MG disintegrating  tablet Take 1 tablet (4 mg total) by mouth every 8 (eight) hours as needed. 02/06/22   Long, Fonda MATSU, MD  oxyCODONE -acetaminophen  (PERCOCET/ROXICET) 5-325 MG tablet Take 1 tablet by mouth every 6 (six) hours as needed for severe pain. 02/06/22   Long, Joshua G, MD  senna-docusate (SENOKOT-S) 8.6-50 MG tablet Take 1 tablet by mouth at bedtime as needed for moderate constipation. 02/06/22   Long, Fonda MATSU, MD  traMADol  (ULTRAM ) 50 MG tablet Take 1 tablet (50 mg total) by mouth every 6 (six) hours as needed for severe pain. 09/02/13   Schorr, Comer SQUIBB, FNP                                                                                                                                    Allergies Patient has no known allergies.  Review of Systems Review of Systems As noted in HPI  Physical  Exam Vital Signs  I have reviewed the triage vital signs BP 108/85   Pulse (!) 55   Temp 97.6 F (36.4 C) (Oral)   Resp 15   Ht 5' 3 (1.6 m)   Wt 63.5 kg   SpO2 96%   BMI 24.80 kg/m   Physical Exam Vitals reviewed.  Constitutional:      General: He is not in acute distress.    Appearance: He is well-developed. He is not diaphoretic.  HENT:     Head: Normocephalic and atraumatic.     Nose: Nose normal.  Eyes:     General: No scleral icterus.       Right eye: No discharge.        Left eye: No discharge.     Conjunctiva/sclera: Conjunctivae normal.     Pupils: Pupils are equal, round, and reactive to light.  Cardiovascular:     Rate and Rhythm: Normal rate and regular rhythm.     Heart sounds: No murmur heard.    No friction rub. No gallop.  Pulmonary:     Effort: Pulmonary effort is normal. No respiratory distress.     Breath sounds: Normal breath sounds. No stridor. No rales.  Abdominal:     General: There is no distension.     Palpations: Abdomen is soft.     Tenderness: There is no abdominal tenderness.  Musculoskeletal:        General: No tenderness.     Cervical back: Normal range  of motion and neck supple.  Skin:    General: Skin is warm and dry.     Findings: No erythema or rash.  Neurological:     Mental Status: He is alert and oriented to person, place, and time.     ED Results and Treatments Labs (all labs ordered are listed, but only abnormal results are displayed) Labs Reviewed  BASIC METABOLIC PANEL - Abnormal; Notable for the following components:      Result Value   BUN 22 (*)    All other components within normal limits  CBC  TROPONIN I (HIGH SENSITIVITY)  TROPONIN I (HIGH SENSITIVITY)                                                                                                                         EKG  EKG Interpretation Date/Time:  Thursday August 13 2023 02:11:56 EST Ventricular Rate:  66 PR Interval:  207 QRS Duration:  90 QT Interval:  366 QTC Calculation: 384 R Axis:   69  Text Interpretation: Sinus rhythm Borderline prolonged PR interval No STEMI No old tracing to compare Confirmed by Trine Likes [phone]) on 08/13/2023 2:49:10 AM       Radiology DG Chest 2 View Result Date: 08/13/2023 CLINICAL DATA:  Chest pain EXAM: CHEST - 2 VIEW COMPARISON:  None Available. FINDINGS: Lungs are clear. No pneumothorax or pleural effusion. Linear lucency is seen at the thoracic inlet overlying the tracheal relate to air within  the esophagus. Cardiomediastinal silhouette is otherwise unremarkable. Pulmonary vascularity is normal. Mild thoracic dextroscoliosis is again noted. No acute bone abnormality IMPRESSION: 1. No active cardiopulmonary disease. Electronically Signed   By: Dorethia Molt M.D.   On: 08/13/2023 03:34    Medications Ordered in ED Medications  alum & mag hydroxide-simeth (MAALOX/MYLANTA) 200-200-20 MG/5ML suspension 30 mL (30 mLs Oral Given 08/13/23 0432)   Procedures Procedures  (including critical care time) Medical Decision Making / ED Course   Medical Decision Making Amount and/or Complexity of Data Reviewed Labs:  ordered. Decision-making details documented in ED Course. Radiology: ordered and independent interpretation performed. Decision-making details documented in ED Course. ECG/medicine tests: ordered and independent interpretation performed. Decision-making details documented in ED Course.  Risk OTC drugs.    Left-sided chest pain  Differential diagnosis and workup considered  Atypical for ACS.  EKG without acute ischemic changes, dysrhythmias or blocks.  Serial troponins negative x 2. Low pretest probability for pulmonary embolism and patient is PERC negative. Presentation not classic for dissection or esophageal perforation. CBC without leukocytosis or anemia. BMP without significant electrolyte derangements or renal sufficiency. Pain improving after GI cocktail favoring GI related process.    Final Clinical Impression(s) / ED Diagnoses Final diagnoses:  Chest discomfort   The patient appears reasonably screened and/or stabilized for discharge and I doubt any other medical condition or other [facility] requiring further screening, evaluation, or treatment in the ED at this time. I have discussed the findings, Dx and Tx plan with the patient/family who expressed understanding and agree(s) with the plan. Discharge instructions discussed at length. The patient/family was given strict return precautions who verbalized understanding of the instructions. No further questions at time of discharge.  Disposition: Discharge  Condition: Good  ED Discharge Orders     None         Follow Up: Primary care provider  Call  to schedule an appointment for close follow up    This chart was dictated using voice recognition software.  Despite best efforts to proofread,  errors can occur which can change the documentation meaning.    Trine Raynell Moder, MD 08/13/23 [phone]

## 2023-08-13 NOTE — ED Triage Notes (Signed)
 Pt states he has been having intermittent chest pain since Monday at 5:30 am. Pt states he was at work when it began and wasn't doing anything strenuous. Pt states pain last around 5-6 minutes each time then instantly goes away. Pt states the pains started coming around 11pm tonight and was coming on every 10 minutes.

## 2024-06-21 ENCOUNTER — Encounter (HOSPITAL_BASED_OUTPATIENT_CLINIC_OR_DEPARTMENT_OTHER): Payer: Self-pay | Admitting: Emergency Medicine

## 2024-06-21 ENCOUNTER — Other Ambulatory Visit: Payer: Self-pay

## 2024-06-21 ENCOUNTER — Emergency Department (HOSPITAL_BASED_OUTPATIENT_CLINIC_OR_DEPARTMENT_OTHER)
Admission: EM | Admit: 2024-06-21 | Discharge: 2024-06-21 | Disposition: A | Discharge status: Home / Self Care | Attending: Emergency Medicine | Admitting: Emergency Medicine

## 2024-06-21 DIAGNOSIS — B353 Tinea pedis: Secondary | ICD-10-CM

## 2024-06-21 DIAGNOSIS — R21 Rash and other nonspecific skin eruption: Secondary | ICD-10-CM | POA: Diagnosis present

## 2024-06-21 DIAGNOSIS — B354 Tinea corporis: Secondary | ICD-10-CM | POA: Diagnosis not present

## 2024-06-21 MED ORDER — CLOTRIMAZOLE 1 % EX CREA: TOPICAL_CREAM | CUTANEOUS | 1 refills | Status: AC

## 2024-06-21 NOTE — ED Notes (Signed)
 Discharge instructions reviewed with patient. Patient verbalizes understanding, no further questions at this time. Medications/prescriptions and follow up information provided. No acute distress noted at time of departure.

## 2024-06-21 NOTE — ED Triage Notes (Signed)
 Left foot skin infection , similar spot on upper right thigh , x 1 week .  Denies Hx diabetes , no chills or fever ,  Adds it is  itchy . No known allergen

## 2024-06-21 NOTE — ED Provider Notes (Signed)
 [facility] Provider Note   CSN: [id number] Arrival date & time: 06/21/24  1035     Patient presents with: Recurrent Skin Infections   John Anderson is a 38 y.o. male with overall noncontributory past medical history presents concern for left foot skin infection/rash, similar spot on upper right thigh and upper left thigh.  Present for about 1 week.  No history of diabetes.  Was prescribed antibiotics and has been taking them without significant improvement.   HPI     Prior to Admission medications  Medication Sig Start Date End Date Taking? Authorizing Provider  clotrimazole  (LOTRIMIN ) 1 % cream Apply to affected area 2 times daily for 2 weeks up to 3 or 4 weeks as needed if rash ongoing 06/21/24  Yes Amandeep Hogston H, PA-C  acetaminophen  (TYLENOL ) 325 MG tablet Take 650 mg by mouth every 6 (six) hours as needed.    [provider]  benzonatate  (TESSALON ) 100 MG capsule Take 1 capsule (100 mg total) by mouth every 8 (eight) hours. 12/24/22   Small, Brooke L, PA  dextromethorphan  15 MG/5ML syrup Take 10 mLs (30 mg total) by mouth 4 (four) times daily as needed for cough. 12/24/22   Small, Brooke L, PA  HYDROcodone -acetaminophen  (NORCO/VICODIN) 5-325 MG per tablet Take 1 tablet by mouth every 4 (four) hours as needed for severe pain. 09/02/13   Schorr, Comer SQUIBB, FNP  ibuprofen  (ADVIL ) 800 MG tablet Take 1 tablet (800 mg total) by mouth every 8 (eight) hours as needed for moderate pain. 02/06/22   Long, Fonda MATSU, MD  lidocaine  (LIDODERM ) 5 % Place 1 patch onto the skin daily. Remove & Discard patch within 12 hours or as directed by MD 08/06/22   Small, Brooke L, PA  ondansetron  (ZOFRAN -ODT) 4 MG disintegrating tablet Take 1 tablet (4 mg total) by mouth every 8 (eight) hours as needed. 02/06/22   Long, Fonda MATSU, MD  oxyCODONE -acetaminophen  (PERCOCET/ROXICET) 5-325 MG tablet Take 1 tablet by mouth every 6 (six) hours as needed for severe  pain. 02/06/22   Long, Joshua G, MD  senna-docusate (SENOKOT-S) 8.6-50 MG tablet Take 1 tablet by mouth at bedtime as needed for moderate constipation. 02/06/22   Long, Fonda MATSU, MD  traMADol  (ULTRAM ) 50 MG tablet Take 1 tablet (50 mg total) by mouth every 6 (six) hours as needed for severe pain. 09/02/13   Schorr, Comer SQUIBB, FNP    Allergies: Patient has no known allergies.    Review of Systems  All other systems reviewed and are negative.   Updated Vital Signs BP (!) 123/92 (BP Location: Right Arm)   Pulse 77   Temp 98 F (36.7 C) (Oral)   Resp 17   Wt 65.8 kg   SpO2 96%   BMI 25.69 kg/m   Physical Exam Vitals and nursing note reviewed.  Constitutional:      General: He is not in acute distress.    Appearance: Normal appearance.  HENT:     Head: Normocephalic and atraumatic.  Eyes:     General:        Right eye: No discharge.        Left eye: No discharge.  Cardiovascular:     Rate and Rhythm: Normal rate and regular rhythm.     Heart sounds: No murmur heard.    No friction rub. No gallop.  Pulmonary:     Effort: Pulmonary effort is normal.     Breath sounds: Normal  breath sounds.  Abdominal:     General: Bowel sounds are normal.     Palpations: Abdomen is soft.  Skin:    General: Skin is warm and dry.     Capillary Refill: Capillary refill takes less than 2 seconds.     Comments: Red flaky rash with some white discoloration, central clearing noted on left foot most severely but also with some scattered patches on right inner and left upper thigh.  Neurological:     Mental Status: He is alert and oriented to person, place, and time.  Psychiatric:        Mood and Affect: Mood normal.        Behavior: Behavior normal.     (all labs ordered are listed, but only abnormal results are displayed) Labs Reviewed - No data to display  EKG: None  Radiology: No results found.   Procedures   Medications Ordered in the ED - No data to display                                   Medical Decision Making  This an overall well-appearing 38 year old male who presents concern for rash to left foot, right upper thigh, left upper thigh.  Emergent differential diagnoses include cellulitis, dermatitis, tinea pedis, tinea corporis versus other.  Physical exam consistent with tinea with white discoloration around areas of rash with central clearing.  Prescribed clotrimazole , discharged in stable condition.  I recommend that he finish the course of antibiotics that he was already prescribed, however discussed I do not feel that it is necessarily because he has an ongoing bacterial infection, more that it is important to finish antibiotics to prevent resistant bacterial infection. Final diagnoses:  Tinea pedis of left foot  Tinea corporis    ED Discharge Orders          Ordered    clotrimazole  (LOTRIMIN ) 1 % cream        06/21/24 1055               Charita Lindenberger, [place] H, PA-C 06/21/24 1101    Emil Share, DO 06/21/24 1311

## 2024-06-21 NOTE — Discharge Instructions (Addendum)
 Your physical exam today is consistent with a fungal infection.  Please apply the antifungal cream that I provided twice daily for at least 2 weeks, but you can extend the course up to 3 or 4 weeks as needed if you are still having some lingering symptoms.  Follow-up with your primary care doctor return if you have no improvement of your symptoms despite treatment.
